# Patient Record
Sex: Female | Born: 1970 | Race: White | Hispanic: No | Marital: Married | Smoking: Never smoker
Health system: Southern US, Community
[De-identification: ages and names within clinical notes are randomized; demographics above are authoritative.]

## PROBLEM LIST (undated history)

## (undated) DIAGNOSIS — E119 Type 2 diabetes mellitus without complications: Secondary | ICD-10-CM

## (undated) DIAGNOSIS — F419 Anxiety disorder, unspecified: Secondary | ICD-10-CM

## (undated) DIAGNOSIS — I10 Essential (primary) hypertension: Secondary | ICD-10-CM

## (undated) DIAGNOSIS — K219 Gastro-esophageal reflux disease without esophagitis: Secondary | ICD-10-CM

## (undated) HISTORY — DX: Gastro-esophageal reflux disease without esophagitis: K21.9

## (undated) HISTORY — DX: Anxiety disorder, unspecified: F41.9

## (undated) HISTORY — DX: Type 2 diabetes mellitus without complications: E11.9

## (undated) HISTORY — DX: Essential (primary) hypertension: I10

---

## 2000-03-31 ENCOUNTER — Encounter: Payer: Self-pay | Admitting: Obstetrics and Gynecology

## 2000-03-31 ENCOUNTER — Ambulatory Visit (HOSPITAL_COMMUNITY): Admission: RE | Admit: 2000-03-31 | Discharge: 2000-03-31 | Payer: Self-pay | Admitting: Obstetrics and Gynecology

## 2000-05-19 ENCOUNTER — Ambulatory Visit (HOSPITAL_COMMUNITY): Admission: RE | Admit: 2000-05-19 | Discharge: 2000-05-19 | Payer: Self-pay | Admitting: Obstetrics & Gynecology

## 2000-05-19 ENCOUNTER — Encounter: Payer: Self-pay | Admitting: Obstetrics & Gynecology

## 2000-05-22 ENCOUNTER — Encounter (HOSPITAL_COMMUNITY): Admission: RE | Admit: 2000-05-22 | Discharge: 2000-08-03 | Payer: Self-pay | Admitting: Obstetrics & Gynecology

## 2000-06-16 ENCOUNTER — Encounter: Payer: Self-pay | Admitting: Obstetrics & Gynecology

## 2000-06-26 ENCOUNTER — Encounter: Payer: Self-pay | Admitting: Obstetrics & Gynecology

## 2000-07-24 ENCOUNTER — Encounter: Payer: Self-pay | Admitting: Obstetrics & Gynecology

## 2000-08-01 ENCOUNTER — Inpatient Hospital Stay (HOSPITAL_COMMUNITY): Admission: AD | Admit: 2000-08-01 | Discharge: 2000-08-04 | Payer: Self-pay | Admitting: Obstetrics & Gynecology

## 2000-08-05 ENCOUNTER — Encounter: Admission: RE | Admit: 2000-08-05 | Discharge: 2000-10-06 | Payer: Self-pay | Admitting: Obstetrics & Gynecology

## 2008-05-03 ENCOUNTER — Ambulatory Visit: Payer: Self-pay | Admitting: Obstetrics & Gynecology

## 2008-05-09 ENCOUNTER — Ambulatory Visit (HOSPITAL_COMMUNITY): Admission: RE | Admit: 2008-05-09 | Discharge: 2008-05-09 | Payer: Self-pay | Admitting: Gynecology

## 2008-05-18 ENCOUNTER — Ambulatory Visit: Payer: Self-pay | Admitting: Obstetrics and Gynecology

## 2008-05-18 ENCOUNTER — Other Ambulatory Visit: Admission: RE | Admit: 2008-05-18 | Discharge: 2008-05-18 | Payer: Self-pay | Admitting: Obstetrics and Gynecology

## 2008-05-18 ENCOUNTER — Encounter: Payer: Self-pay | Admitting: Obstetrics and Gynecology

## 2008-06-13 ENCOUNTER — Ambulatory Visit: Payer: Self-pay | Admitting: Obstetrics & Gynecology

## 2008-06-20 ENCOUNTER — Ambulatory Visit (HOSPITAL_COMMUNITY): Admission: RE | Admit: 2008-06-20 | Discharge: 2008-06-20 | Payer: Self-pay | Admitting: Gynecology

## 2008-06-20 ENCOUNTER — Ambulatory Visit: Payer: Self-pay | Admitting: Obstetrics & Gynecology

## 2008-07-11 ENCOUNTER — Ambulatory Visit: Payer: Self-pay | Admitting: Obstetrics & Gynecology

## 2008-07-15 ENCOUNTER — Ambulatory Visit: Payer: Self-pay | Admitting: Family Medicine

## 2008-07-15 ENCOUNTER — Encounter: Payer: Self-pay | Admitting: Obstetrics & Gynecology

## 2008-07-15 LAB — CONVERTED CEMR LAB
Albumin: 3.6 g/dL (ref 3.5–5.2)
Alkaline Phosphatase: 64 units/L (ref 39–117)
BUN: 10 mg/dL (ref 6–23)
CO2: 19 meq/L (ref 19–32)
Collection Interval-CRCL: 24 hr
Creatinine 24 HR UR: 2174 mg/24hr — ABNORMAL HIGH (ref 700–1800)
Creatinine Clearance: 216 mL/min — ABNORMAL HIGH (ref 75–115)
Glucose, Bld: 102 mg/dL — ABNORMAL HIGH (ref 70–99)
Hemoglobin: 12.4 g/dL (ref 12.0–15.0)
LDH: 133 units/L (ref 94–250)
MCHC: 32.4 g/dL (ref 30.0–36.0)
MCV: 86.7 fL (ref 78.0–100.0)
RBC: 4.42 M/uL (ref 3.87–5.11)
Total Bilirubin: 0.3 mg/dL (ref 0.3–1.2)
Uric Acid, Serum: 4.7 mg/dL (ref 2.4–7.0)
WBC: 12.2 10*3/uL — ABNORMAL HIGH (ref 4.0–10.5)

## 2008-08-01 ENCOUNTER — Ambulatory Visit: Payer: Self-pay | Admitting: Family Medicine

## 2008-08-08 ENCOUNTER — Ambulatory Visit: Payer: Self-pay | Admitting: Family Medicine

## 2008-08-08 ENCOUNTER — Encounter: Payer: Self-pay | Admitting: Obstetrics & Gynecology

## 2008-08-08 LAB — CONVERTED CEMR LAB
Hemoglobin: 12 g/dL (ref 12.0–15.0)
MCHC: 31.9 g/dL (ref 30.0–36.0)
MCV: 86.4 fL (ref 78.0–100.0)
RBC: 4.35 M/uL (ref 3.87–5.11)
RDW: 13.9 % (ref 11.5–15.5)

## 2008-08-19 ENCOUNTER — Ambulatory Visit (HOSPITAL_COMMUNITY): Admission: RE | Admit: 2008-08-19 | Discharge: 2008-08-19 | Payer: Self-pay | Admitting: Obstetrics & Gynecology

## 2008-08-22 ENCOUNTER — Ambulatory Visit: Payer: Self-pay | Admitting: Obstetrics and Gynecology

## 2008-09-07 ENCOUNTER — Ambulatory Visit: Payer: Self-pay | Admitting: Obstetrics and Gynecology

## 2008-09-14 ENCOUNTER — Ambulatory Visit (HOSPITAL_COMMUNITY): Admission: RE | Admit: 2008-09-14 | Discharge: 2008-09-14 | Payer: Self-pay | Admitting: Obstetrics & Gynecology

## 2008-09-19 ENCOUNTER — Ambulatory Visit: Payer: Self-pay | Admitting: Family Medicine

## 2008-09-19 ENCOUNTER — Encounter: Payer: Self-pay | Admitting: Obstetrics & Gynecology

## 2008-09-19 LAB — CONVERTED CEMR LAB
ALT: 8 units/L (ref 0–35)
AST: 15 units/L (ref 0–37)
Alkaline Phosphatase: 104 units/L (ref 39–117)
BUN: 7 mg/dL (ref 6–23)
Calcium: 8.6 mg/dL (ref 8.4–10.5)
Chloride: 106 meq/L (ref 96–112)
Creatinine, Ser: 0.6 mg/dL (ref 0.40–1.20)
HCT: 38.1 % (ref 36.0–46.0)
Hemoglobin: 12.7 g/dL (ref 12.0–15.0)
MCHC: 33.3 g/dL (ref 30.0–36.0)
Platelets: 321 10*3/uL (ref 150–400)
RDW: 13.1 % (ref 11.5–15.5)
Total Bilirubin: 0.3 mg/dL (ref 0.3–1.2)

## 2008-09-21 ENCOUNTER — Ambulatory Visit: Payer: Self-pay | Admitting: Family Medicine

## 2008-09-21 ENCOUNTER — Ambulatory Visit (HOSPITAL_COMMUNITY): Admission: RE | Admit: 2008-09-21 | Discharge: 2008-09-21 | Payer: Self-pay | Admitting: Family Medicine

## 2008-09-21 ENCOUNTER — Encounter: Payer: Self-pay | Admitting: Obstetrics & Gynecology

## 2008-09-21 LAB — CONVERTED CEMR LAB
Creatinine 24 HR UR: 1862 mg/24hr — ABNORMAL HIGH (ref 700–1800)
Creatinine Clearance: 216 mL/min — ABNORMAL HIGH (ref 75–115)
Creatinine, Urine: 62.1 mg/dL
Protein, Ur: 120 mg/24hr — ABNORMAL HIGH (ref 50–100)

## 2008-09-26 ENCOUNTER — Ambulatory Visit: Payer: Self-pay | Admitting: Obstetrics & Gynecology

## 2008-09-29 ENCOUNTER — Ambulatory Visit (HOSPITAL_COMMUNITY): Admission: RE | Admit: 2008-09-29 | Discharge: 2008-09-29 | Payer: Self-pay | Admitting: Family Medicine

## 2008-10-03 ENCOUNTER — Ambulatory Visit: Payer: Self-pay | Admitting: Obstetrics & Gynecology

## 2008-10-06 ENCOUNTER — Ambulatory Visit (HOSPITAL_COMMUNITY): Admission: RE | Admit: 2008-10-06 | Discharge: 2008-10-06 | Payer: Self-pay | Admitting: Family Medicine

## 2008-10-11 ENCOUNTER — Ambulatory Visit: Payer: Self-pay | Admitting: Obstetrics and Gynecology

## 2008-10-13 ENCOUNTER — Ambulatory Visit: Payer: Self-pay | Admitting: Obstetrics and Gynecology

## 2008-10-13 ENCOUNTER — Inpatient Hospital Stay (HOSPITAL_COMMUNITY): Admission: AD | Admit: 2008-10-13 | Discharge: 2008-10-13 | Payer: Self-pay | Admitting: Obstetrics & Gynecology

## 2008-10-16 ENCOUNTER — Ambulatory Visit: Payer: Self-pay | Admitting: Advanced Practice Midwife

## 2008-10-16 ENCOUNTER — Inpatient Hospital Stay (HOSPITAL_COMMUNITY): Admission: AD | Admit: 2008-10-16 | Discharge: 2008-10-16 | Payer: Self-pay | Admitting: Obstetrics & Gynecology

## 2008-10-17 ENCOUNTER — Ambulatory Visit: Payer: Self-pay | Admitting: Obstetrics & Gynecology

## 2008-10-18 ENCOUNTER — Encounter: Payer: Self-pay | Admitting: Obstetrics & Gynecology

## 2008-10-18 LAB — CONVERTED CEMR LAB
Chlamydia, DNA Probe: NEGATIVE
GC Probe Amp, Genital: NEGATIVE

## 2008-10-20 ENCOUNTER — Ambulatory Visit: Payer: Self-pay | Admitting: Obstetrics & Gynecology

## 2008-10-25 ENCOUNTER — Ambulatory Visit: Payer: Self-pay | Admitting: Obstetrics and Gynecology

## 2008-10-27 ENCOUNTER — Ambulatory Visit: Payer: Self-pay | Admitting: Obstetrics & Gynecology

## 2008-10-31 ENCOUNTER — Ambulatory Visit: Payer: Self-pay | Admitting: Family Medicine

## 2008-11-03 ENCOUNTER — Ambulatory Visit: Payer: Self-pay | Admitting: Obstetrics & Gynecology

## 2008-11-07 ENCOUNTER — Ambulatory Visit: Payer: Self-pay | Admitting: Obstetrics & Gynecology

## 2008-11-10 ENCOUNTER — Ambulatory Visit: Payer: Self-pay | Admitting: Family Medicine

## 2008-11-10 ENCOUNTER — Inpatient Hospital Stay (HOSPITAL_COMMUNITY): Admission: AD | Admit: 2008-11-10 | Discharge: 2008-11-12 | Payer: Self-pay | Admitting: Obstetrics and Gynecology

## 2009-01-02 ENCOUNTER — Ambulatory Visit: Payer: Self-pay | Admitting: Family Medicine

## 2009-05-03 ENCOUNTER — Emergency Department: Payer: Self-pay | Admitting: Emergency Medicine

## 2010-09-23 HISTORY — PX: CHOLECYSTECTOMY: SHX55

## 2010-10-26 IMAGING — US US OB FOLLOW-UP
1 series · 14 of 24 positions shown · non-contrast
Comparison: none

OBSTETRICAL ULTRASOUND:
 This ultrasound exam was performed in the [HOSPITAL] Ultrasound Department.  The OB US report was generated in the AS system, and faxed to the ordering physician.  This report is also available in [REDACTED] PACS.

[Series 1: us ob re-eval · 14 of 24 slices shown]
[im 1/24]
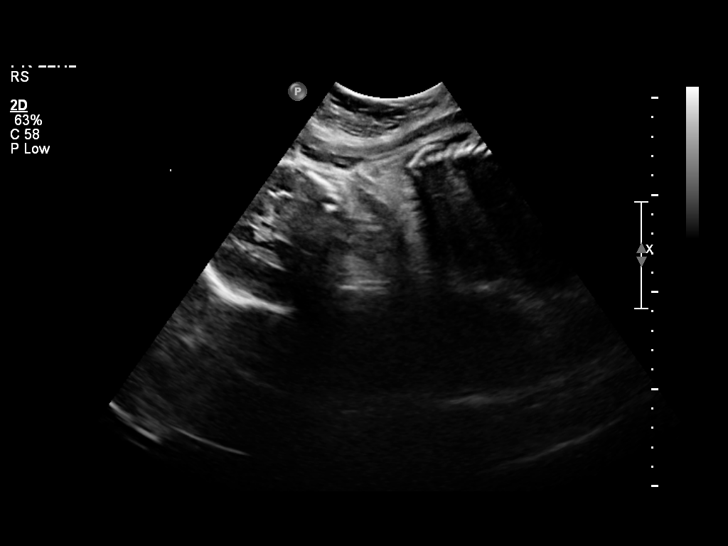
[im 3/24]
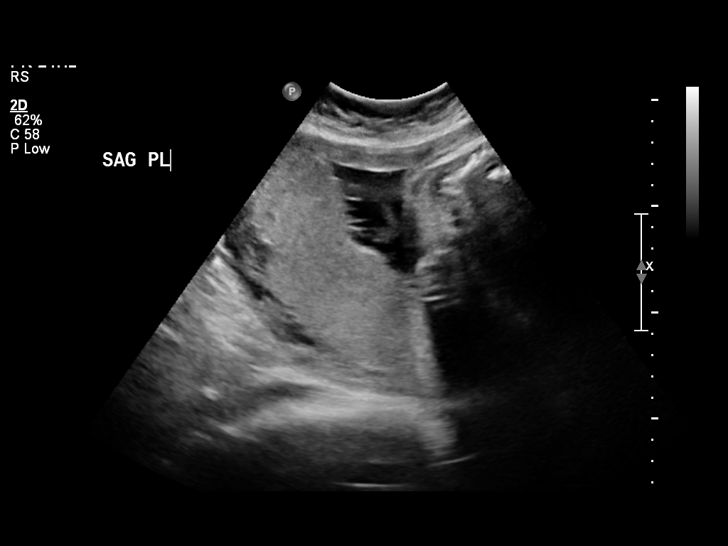
[im 5/24]
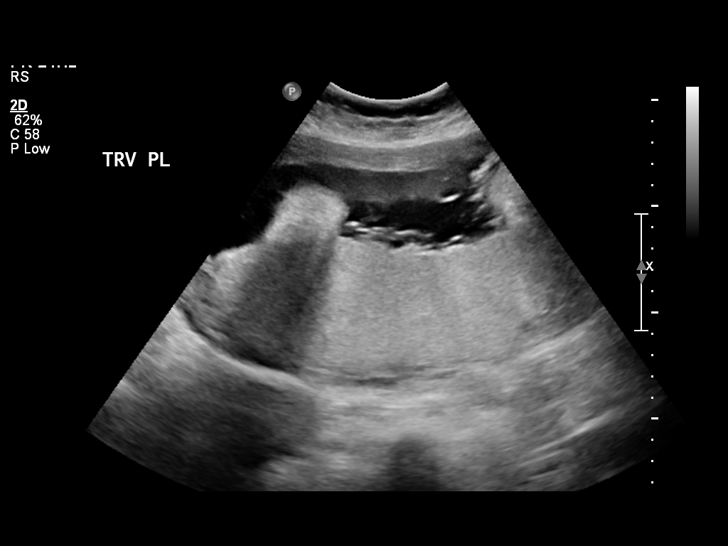
[im 7/24]
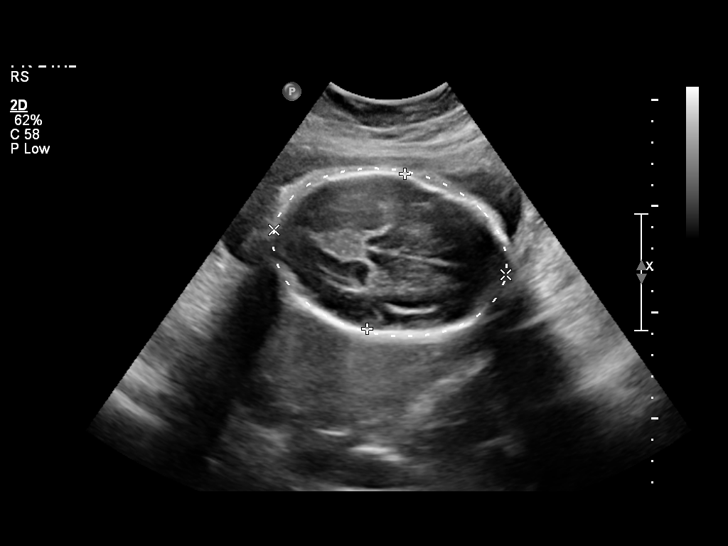
[im 8/24]
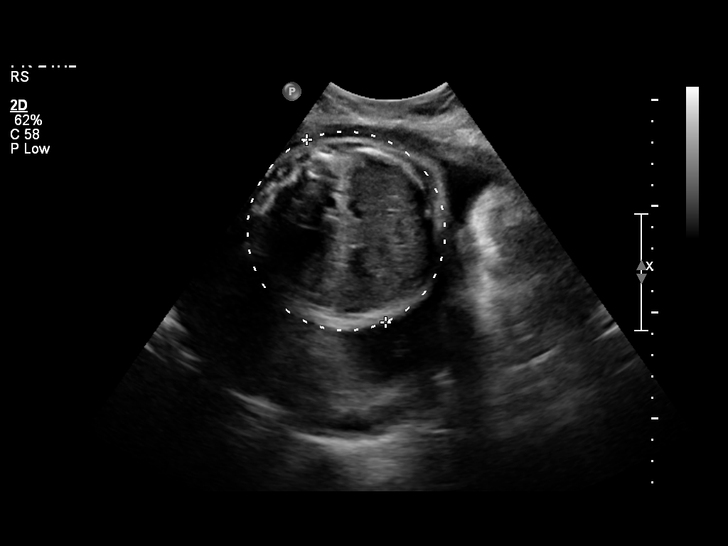
[im 10/24]
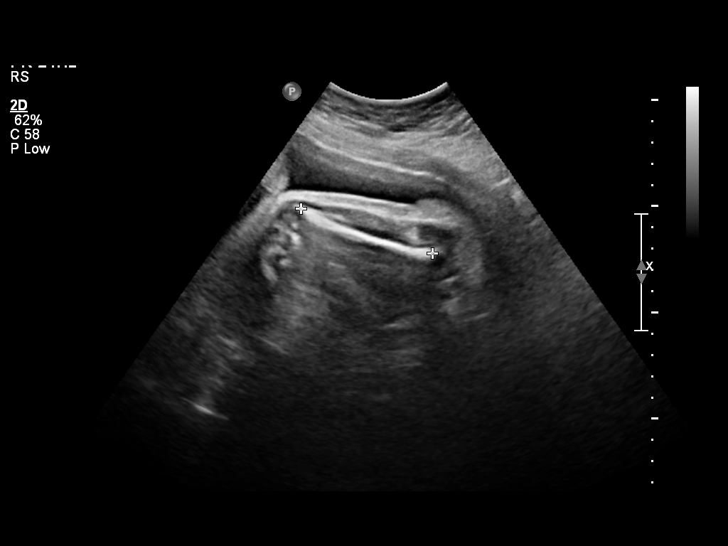
[im 12/24]
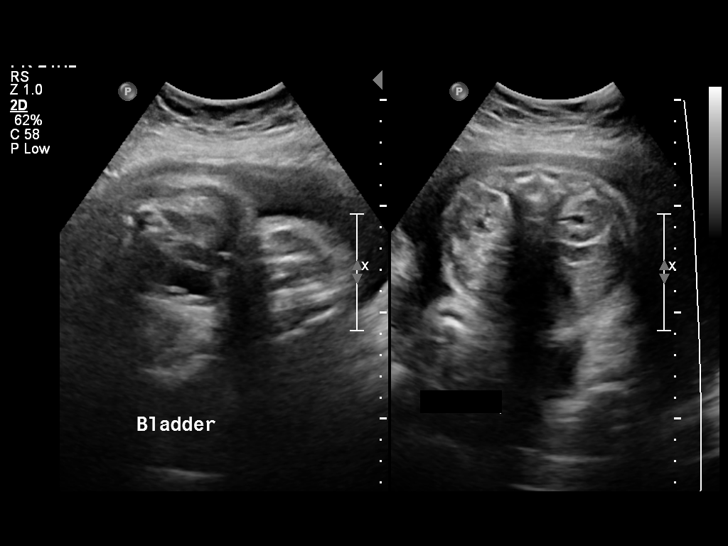
[im 13/24]
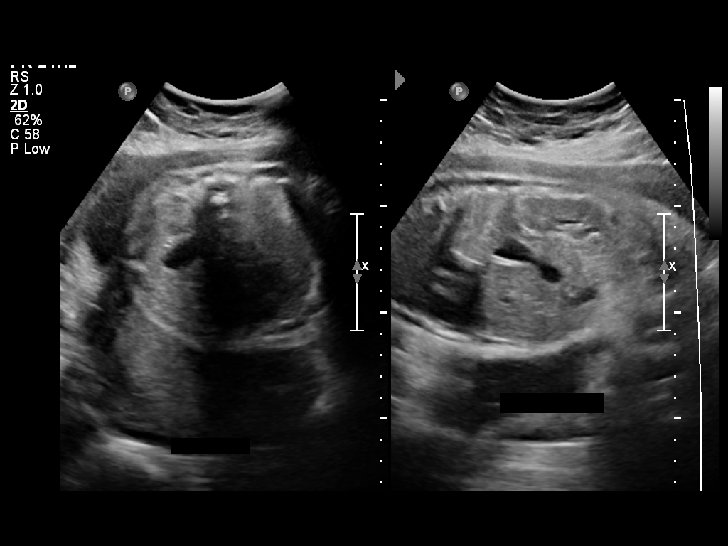
[im 15/24]
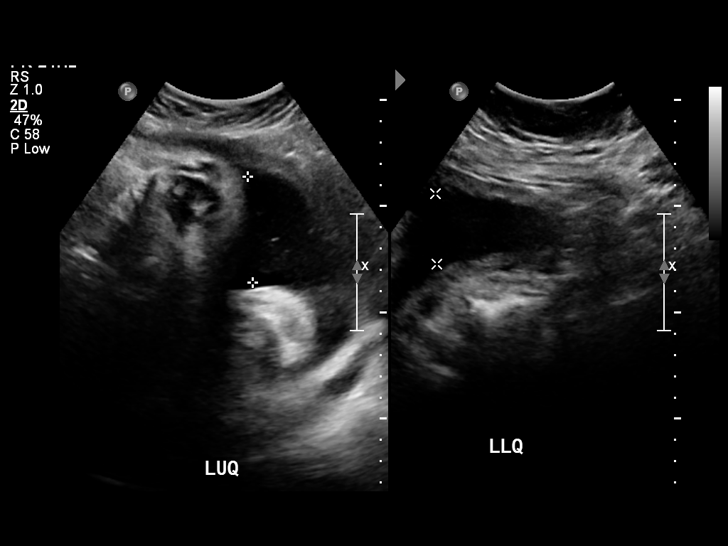
[im 17/24]
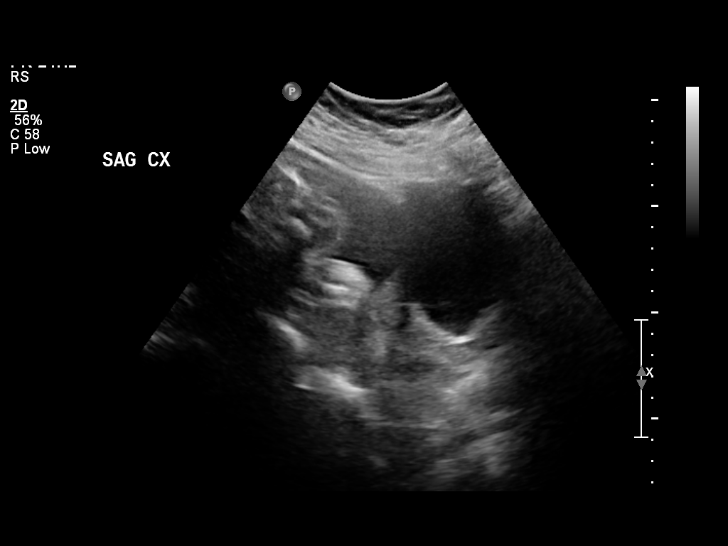
[im 19/24]
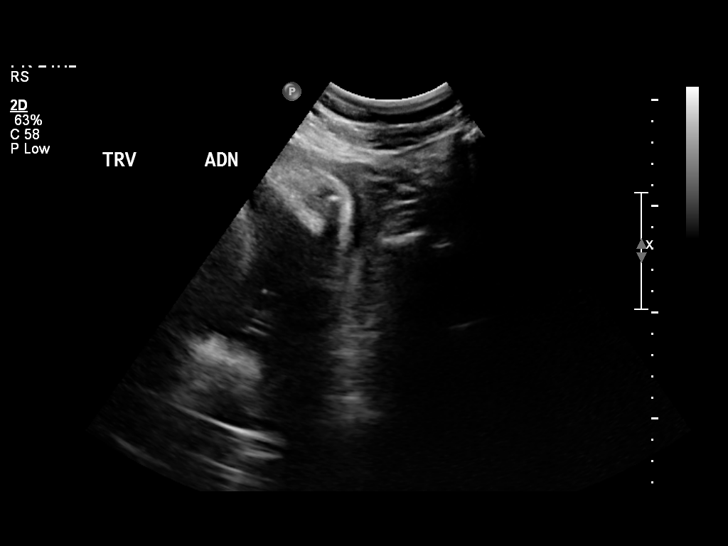
[im 20/24]
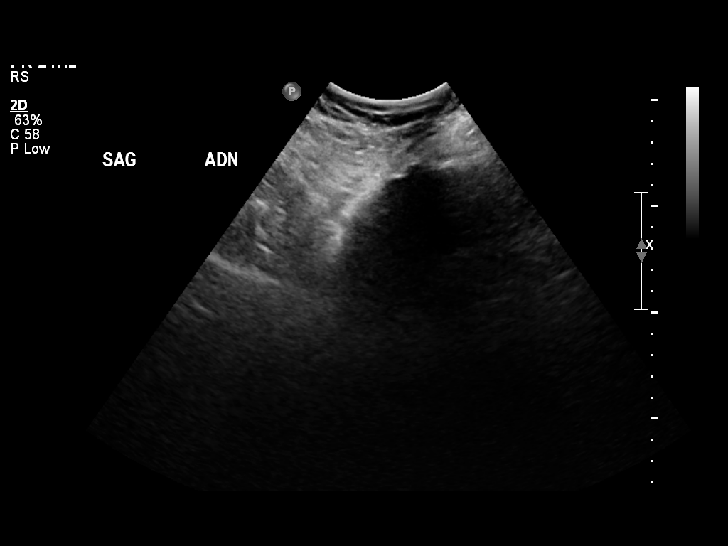
[im 22/24]
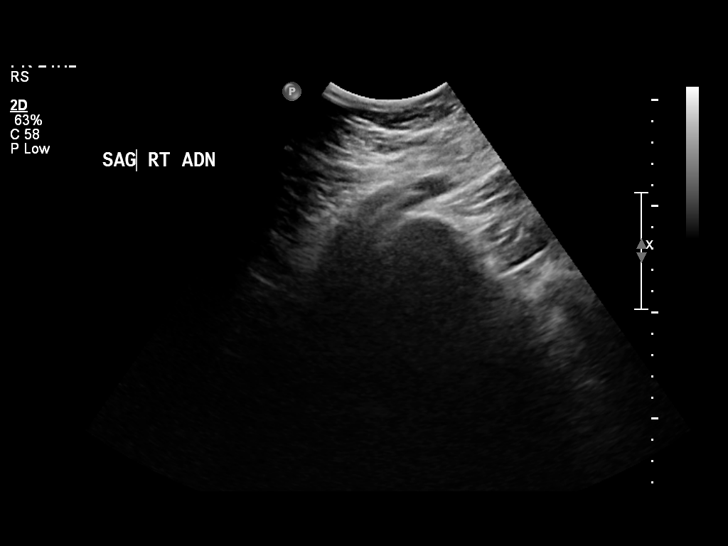
[im 24/24]
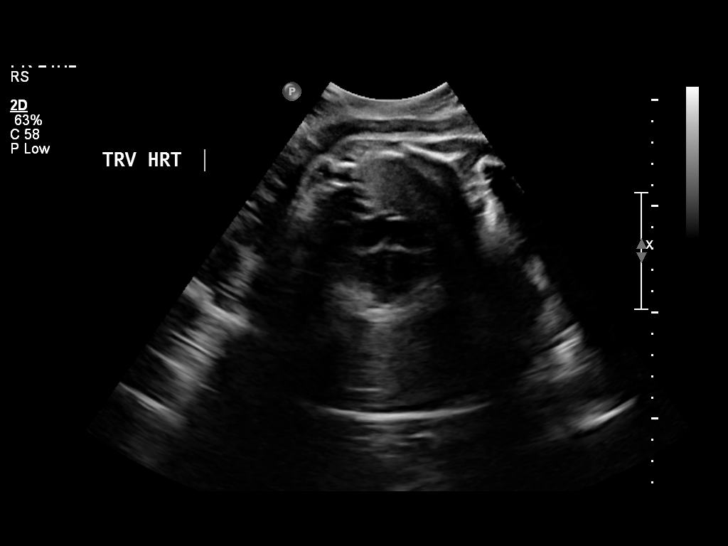

[14 of 24 positions shown; findings below may reference images not displayed]

IMPRESSION: See AS Obstetric US report.

## 2011-01-07 LAB — URINALYSIS, ROUTINE W REFLEX MICROSCOPIC
Bilirubin Urine: NEGATIVE
Glucose, UA: NEGATIVE mg/dL
Hgb urine dipstick: NEGATIVE
Protein, ur: NEGATIVE mg/dL
Urobilinogen, UA: 0.2 mg/dL (ref 0.0–1.0)

## 2011-01-07 LAB — URINE MICROSCOPIC-ADD ON

## 2011-01-08 LAB — COMPREHENSIVE METABOLIC PANEL
ALT: 11 U/L (ref 0–35)
Albumin: 2.6 g/dL — ABNORMAL LOW (ref 3.5–5.2)
Alkaline Phosphatase: 169 U/L — ABNORMAL HIGH (ref 39–117)
Chloride: 105 mEq/L (ref 96–112)
Glucose, Bld: 96 mg/dL (ref 70–99)
Potassium: 3.7 mEq/L (ref 3.5–5.1)
Sodium: 132 mEq/L — ABNORMAL LOW (ref 135–145)
Total Bilirubin: 0.2 mg/dL — ABNORMAL LOW (ref 0.3–1.2)
Total Protein: 5.6 g/dL — ABNORMAL LOW (ref 6.0–8.3)

## 2011-01-08 LAB — CBC
HCT: 35.1 % — ABNORMAL LOW (ref 36.0–46.0)
MCV: 78.8 fL (ref 78.0–100.0)
Platelets: 293 10*3/uL (ref 150–400)
RBC: 4.45 MIL/uL (ref 3.87–5.11)
WBC: 14.7 10*3/uL — ABNORMAL HIGH (ref 4.0–10.5)

## 2011-01-08 LAB — RPR: RPR Ser Ql: NONREACTIVE

## 2011-02-05 NOTE — Assessment & Plan Note (Signed)
[name], [name]              ACCOUNT NO.:  [id number]   MEDICAL RECORD NO.:  [id number]          PATIENT TYPE:  POB   LOCATION:  [facility] at [facility]         FACILITY:  [facility]   PHYSICIAN:  Tinnie Gens, MD        DATE OF BIRTH:  1970-10-16   DATE OF SERVICE:  01/02/2009                                  CLINIC NOTE   CHIEF COMPLAINT:  Postpartum check.   HISTORY OF PRESENT ILLNESS:  The patient is a 40 year old, gravida 3,  para 3, who is status post VBAC of a 10 pounds 4 ounce female who also had  a shoulder dystocia at the time of delivery.  She did have some initial  sort of decreased arm movement.  However, that has completely resolved.  She still has some issues with weight gain.  She has pumping, nursing,  and supplementing some with formula Tums.  He is finally gaining some  weight, but not really sleeping through the nights, had to wake up very  3 hours for feeding to keep his weight up, but she reports her mood is  well.  She is not really having not much issues, just tired.  She has  not resumed sexual intercourse, waiting for husband to make an  appointment for vasectomy.  She is on no medications for chronic  hypertension, which she is being followed for during her pregnancy.   PHYSICAL EXAMINATION:  VITAL SIGNS:  Today her weight is 247.  Her blood  pressure is 142/85.  GENERAL:  She is a well-developed, well-nourished female in no acute  distress.  GU:  Normal external female genitalia.  BUS normal.  Vagina is pink and  rugated.  Cervix is parous without lesion.  Uterus is small, anteverted.  No adnexal mass or tenderness.   IMPRESSION:  1. Postpartum check, doing well.  2. Chronic hypertension.  3. Obesity.   PLAN:  The patient has a Pap smear in August of this year.  She will  return for that and might follow up for birth control as needed and to  monitor her blood pressures.           ______________________________  Tinnie Gens, MD     TP/MEDQ  D:   01/02/2009  T:  01/03/2009  Job:  [id number]

## 2011-02-08 NOTE — Discharge Summary (Signed)
[facility]  Patient:    [name], [name] [facility]                   MRN: [id number] Adm. Date:  08/01/00 Disc. Date: 08/04/00 Attending:  Cleatrice Burke Dictator:   Nigel Bridgeman, C.N.M.                           Discharge Summary  ADMITTING DIAGNOSES:          1. Intrauterine pregnancy at term.                               2. Breech presentation.  DISCHARGE DIAGNOSES:          1. Intrauterine pregnancy at term.                               2. Breech presentation.  PROCEDURES:                   1. Primary low transverse cesarean section.                               2. Combination epidural and spinal anesthesia.  HOSPITAL COURSE:              [name] is a 40 year old, gravida 2, para 1-0-0-1, at 38-1/2 weeks who presented for scheduled C-section secondary to breech presentation.  Pregnancy had been remarkable for:                                1. Positive group B Strep.                               2. Polycystic ovarian syndrome.                               3. Chronic hypertension.                               4. History of infertility.                               5. Obesity.                               6. Subjective oligohydramnios                               7. Jehovahs Witness.  The patient was taken to the operating room where primary low transverse cesarean section was performed for breech presentation under a combination epidural and spinal.  Findings were a viable female by the name of McKenzie. Apgars were 9 and 9.  Weight was 8 pounds 7 ounces.  There were normal tubes and ovaries found.  There were no complications.  Estimated blood loss was 500 cc.  The patient was taken to the recovery room in good condition.  The  infant was taken to the full-term nursery in good condition.  On postoperative day #1, the patient was doing well.  Breast feeding was going slowly.  Her physical examination was within normal limits.  Her  hemoglobin on day #1 postpartum was 10.8, WBC count was 13.4.  She was electing condoms for contraception.  The rest of her hospital course was uncomplicated.  On postoperative day #3, her incision was clean, dry, and intact.  She was pumping more breast milk.  Her chest was clear and dry.  Lochia was scant and her fundus was fundus was firm.  She was deemed to have received full benefit of her hospital stay and was discharged home.  DISCHARGE INSTRUCTIONS:       Per [facility] handout.  DISCHARGE MEDICATIONS:        1. Motrin 600 mg p.o. q.6h. p.r.n. pain.                               2. Tylox 1-2 p.o. q.3-4h. p.r.n. pain.  DISCHARGE FOLLOWUP:           This will occur in six weeks at [facility]. DD:  08/04/00 TD:  08/04/00 Job: [id number]

## 2011-02-08 NOTE — Op Note (Signed)
[facility]  Patient:    [name], [name] [facility]                   MRN: [id number] Proc. Date: 08/01/00 Adm. Date:  [id number] Attending:  Cleatrice Burke                           Operative Report  PREOPERATIVE DIAGNOSIS:       Breech at term.  SURGEON:                      Cecilio Asper, M.D.  ASSISTANT:                    Nigel Bridgeman, C.N.M.  PROCEDURE:                    Low transverse cesarean delivery.  ANESTHESIA:                   Combination epidural and spinal.  ESTIMATED BLOOD LOSS:         500 cc.  URINE OUTPUT:                 300 cc.  FINDINGS:                     A viable female infant named McKenzie.  Apgars 9 and 9.  Weight 8 pounds 7 ounces.  Normal tubes and ovaries.  COMPLICATIONS:                None.  HISTORY:                      Patient is a 40 year old who has been followed during her pregnancy for borderline oligohydramnios.  She was found on multiple ultrasounds to be in breech presentation.  On the day of surgery the patient was properly identified and by ultrasound the breech was confirmed. She was therefore consented for cesarean delivery.  DESCRIPTION OF PROCEDURE:     There was some difficulty with achieving anesthesia.  The patient ended up with a combination epidural and spinal. Once an adequate level had been obtained patient was prepped in a sterile fashion.  A Foley was placed inside the bladder to drain it of urine and then the patient was subsequently draped.  A low transverse incision was made down through the subcutaneous fat to the fascia.  There were multiple subcutaneous bleeders that were made hemostatic with the Bovie.  The fascia was then incised on either side of the midline and extended laterally in both directions with the Mayo scissors.  Kocher clamps were placed on the superior edge of the incision and the fascia was removed from the rectus muscle both superiorly and inferiorly.   There were also a few rectus muscle bleeders that were made hemostatic with the Bovie.  Parietal peritoneum was identified and grasped and was incised with the Mayo scissors and extended bluntly.  Bladder piece was placed inside of the incision.  The visceroperitoneum of the lower uterine segment was elevated and incised in order to develop the bladder flap. Bladder piece was placed inside of this flap.  The uterus was entered in a low transverse cesarean fashion, extended laterally with bandage scissors.  The infant was found to be footling breech.  The feet were grasped without difficulty and breech extraction was performed using normal technique.  The infant was suctioned on the operative field.  Cord was doubly clamped and cut and the infant was taken to the warmer where she was cared for by the respiratory team.  Cord bloods were obtained.  Placenta was manually extracted.  Uterus firmed up nicely with Pitocin.  The uterus was removed from the pelvic abdominal region and covered with a wet lap sponge.  Bladder pieces were placed inside of the incision.  The uterine incision was grasped with ring clamps.  The uterus was wiped clean with a wet lap sponge.  The uterus was reapproximated with a running suture of #0 Vicryl.  A second embrocating layer was used with good hemostatic result.  Tubes and ovaries were noted to be normal.  The uterus was replaced inside of the pelvic abdominal region. Irrigation was performed.  Hemostasis was assured.  Parietal peritoneum was then reapproximated manually.  Fascia was then reapproximated from the lateral edge to the midline with a running suture of 1-0 Vicryl.  Subcuticular was closed with a running suture of 0 Vicryl.  Subcuticular bleeders were made hemostatic with the Bovie.  Skin staples were applied.  Bandages were applied. Patient tolerated procedure well.  She was taken to the recovery room in stable condition.  Needle, sponge, and instrument  counts were correct x 2.  POSTOPERATIVE DIAGNOSIS:      Breech at term. DD:  08/01/00 TD:  08/01/00 Job: [id number]

## 2011-02-08 NOTE — H&P (Signed)
[facility] [place]  Patient:    [name], [name] [facility]                   MRN: [id number] Adm. Date:  [id number] Attending:  Cleatrice Burke Dictator:   Nigel Bridgeman, C.N.M.                         History and Physical  HISTORY OF PRESENT ILLNESS:   Ms. Casad is a 40 year old gravida 2, para 1-0-0-1 at 52 weeks, who presents for scheduled cesarean section secondary to breech presentation.  Pregnancy has been remarkable for                                1. Positive group B strep.                               2. Polycystic ovarian syndrome.                               3. Chronic hypertension.                               4. History of infertility.                               5. Obesity.                               6. Subjective oligohydramnios.                               7. Jehovahs Witness.                               8. History of fibroids.  PRENATAL LABORATORIES:        Blood type is AB-positive, Rh antibody negative, VDRL nonreactive, rubella titer positive, hepatitis B surface antigen negative, HIV nonreactive, GC and chlamydia cultures were negative, Pap showed inflammatory changes, group B strep culture was positive for group B strep in April and May, one-hour glucose challenge was normal, early pregnancy and at 28 weeks her ______ was negative, her hemoglobin A1C was 5.2.  Hemoglobin at 26 weeks was 11.8.  EDC of August 18, 2000 was established by ultrasound at approximately nine weeks and was approximately eight days different from her LMP dating.  HISTORY OF PRESENT PREGNANCY: Patient entered care as a transfer from [facility] at approximately 14 weeks.  She originally planned nurse midwifery care.  She had an ultrasound done at 21 weeks that was within normal limits. She began having some elevations of her blood pressure at 25 weeks.  Medical doctor management was recommended at that point.  Patient had an  ultrasound performed at 26 weeks, which showed oligohydramnios.  She was placed on bedrest at that time.  Oligohydramnios was verified on repeat ultrasound at [facility] in two weeks.  NSTs were begun two times per week at 28 weeks.  She continued to have normal NSTs.  She had an ultrasound at  34 weeks, which showed normal Doppler flow.  Estimated fetal weight at 77th percentile, AFI of 10, but subjectively low fluid.  Rest of her pregnancy was essentially uncomplicated, however, the baby did remain in a breech presentation.  Her fluid did improve in a late third trimester ultrasound.  She then elected to proceed with scheduled cesarean section for breech.  OBSTETRICAL HISTORY:          In 1989, she had a vaginal birth of a female infant weighed 8 pounds 5 ounces at [redacted] weeks gestation.  She was in labor approximately 8 hours.  She had no anesthesia.  She was induced for postdate. She did have meconium-stained fluid.  MEDICAL HISTORY:              She had an ovarian cyst diagnosed in 1995.  She had fibroids diagnosed in 2001.  She was treated for infertility with metformin for seven months and then Clomid times one cycle in August 2000. She had one yeast infection in the past.  She does have a cat.  She had childhood illnesses.  She had a UTI in the past and in 1991.  Only hospitalization was for childbirth.  She was diagnosed with polycystic ovarian syndrome in the past and was on glucophage to regulate.  She has no known medication allergies.  She is allergic to [facility].  Patient had eczema.  FAMILY HISTORY:               Her mother has varicosities.  Her father had a stroke x 2 and is now deceased.  Her maternal grandmother had cervical or ovarian cancer.  GENETIC HISTORY:              Remarkable for the patients father having muscular dystrophy versus some type of multiple sclerosis.  SOCIAL HISTORY:               Patient is married to the father of the baby, he is involved  and supportive.  His name is Tametha Banning.  Patient is Caucasian. 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  Patient has her GED.  She is a Futures trader.  Her husband is high-school educated and is a Medical illustrator.  She was originally followed by the certified nurse midwifery service, but then transferred to the physician service in early second trimester secondary to hypertension.  She denies any alcohol, drug or tobacco use during this pregnancy.  PHYSICAL EXAMINATION:  VITAL SIGNS:                  Stable.  Patient is afebrile.  HEENT:                        Within normal limits.  LUNGS:                        Bilateral breath sounds are clear.  HEART:                        Regular rate and rhythm without murmur.  BREASTS:                      Soft and nontender.  ABDOMEN:                      Fundal height is approximately  39 to 40 cm. Estimated fetal weight is 8 to 8.5 pounds.  Uterine contractions are very sporadic and mild.  Fetal heart rate is in the 150s by Doppler.  PELVIC EXAMINATION:           Deferred.  EXTREMITIES:                  Deep tendon reflexes are 2+ without clonus.                               There is trace edema noted.  IMPRESSION:                   1. Intrauterine pregnancy at term.                               2. Breech presentation.                               3. History of polycystic ovarian syndrome.                               4. History of chronic hypertension, but no                                  medications needed.                               5. Patient is a Air traffic controller Witness and declines                                  blood or blood products.  PLAN:                         1. Admit to [facility] for                                  scheduled primary low-transverse cesarean                                  section.                                2. Routine physician orders.                               3. Declination of transfusion, guidelines are                                  contained within the patients chart. DD:  08/01/00  TD:  08/01/00 Job: [id number]

## 2014-01-17 DIAGNOSIS — E282 Polycystic ovarian syndrome: Secondary | ICD-10-CM | POA: Insufficient documentation

## 2014-10-25 DIAGNOSIS — E1142 Type 2 diabetes mellitus with diabetic polyneuropathy: Secondary | ICD-10-CM | POA: Insufficient documentation

## 2014-10-25 DIAGNOSIS — F419 Anxiety disorder, unspecified: Secondary | ICD-10-CM | POA: Insufficient documentation

## 2019-05-06 DIAGNOSIS — Z0000 Encounter for general adult medical examination without abnormal findings: Secondary | ICD-10-CM | POA: Insufficient documentation

## 2019-05-06 DIAGNOSIS — N939 Abnormal uterine and vaginal bleeding, unspecified: Secondary | ICD-10-CM | POA: Insufficient documentation

## 2019-11-05 LAB — LIPID PANEL
Cholesterol: 204 — AB (ref 0–200)
HDL: 58 (ref 35–70)
LDL Cholesterol: 103
Triglycerides: 214 — AB (ref 40–160)

## 2020-04-13 LAB — BASIC METABOLIC PANEL
BUN: 10 (ref 4–21)
CO2: 30 — AB (ref 13–22)
Chloride: 103 (ref 99–108)
Creatinine: 0.6 (ref 0.5–1.1)
Glucose: 178
Potassium: 4 (ref 3.4–5.3)
Sodium: 138 (ref 137–147)

## 2020-04-13 LAB — COMPREHENSIVE METABOLIC PANEL
Calcium: 10 (ref 8.7–10.7)
GFR calc Af Amer: 90
GFR calc non Af Amer: 90

## 2020-04-13 LAB — VITAMIN D 25 HYDROXY (VIT D DEFICIENCY, FRACTURES): Vit D, 25-Hydroxy: 30.7

## 2020-04-13 LAB — MICROALBUMIN, URINE: Microalb, Ur: 56.3

## 2020-04-13 LAB — HEMOGLOBIN A1C: Hemoglobin A1C: 7.7

## 2020-11-20 ENCOUNTER — Ambulatory Visit (INDEPENDENT_AMBULATORY_CARE_PROVIDER_SITE_OTHER): Payer: 59 | Admitting: Family Medicine

## 2020-11-20 ENCOUNTER — Encounter: Payer: Self-pay | Admitting: Family Medicine

## 2020-11-20 ENCOUNTER — Other Ambulatory Visit: Payer: Self-pay

## 2020-11-20 VITALS — BP 172/80 | HR 86 | Temp 98.2°F | Resp 16 | Ht 68.0 in | Wt 258.6 lb

## 2020-11-20 DIAGNOSIS — E785 Hyperlipidemia, unspecified: Secondary | ICD-10-CM

## 2020-11-20 DIAGNOSIS — R1319 Other dysphagia: Secondary | ICD-10-CM | POA: Diagnosis not present

## 2020-11-20 DIAGNOSIS — E1159 Type 2 diabetes mellitus with other circulatory complications: Secondary | ICD-10-CM

## 2020-11-20 DIAGNOSIS — K219 Gastro-esophageal reflux disease without esophagitis: Secondary | ICD-10-CM | POA: Diagnosis not present

## 2020-11-20 DIAGNOSIS — E1169 Type 2 diabetes mellitus with other specified complication: Secondary | ICD-10-CM

## 2020-11-20 DIAGNOSIS — E119 Type 2 diabetes mellitus without complications: Secondary | ICD-10-CM | POA: Insufficient documentation

## 2020-11-20 DIAGNOSIS — F410 Panic disorder [episodic paroxysmal anxiety] without agoraphobia: Secondary | ICD-10-CM | POA: Diagnosis not present

## 2020-11-20 DIAGNOSIS — Z2821 Immunization not carried out because of patient refusal: Secondary | ICD-10-CM

## 2020-11-20 DIAGNOSIS — I152 Hypertension secondary to endocrine disorders: Secondary | ICD-10-CM

## 2020-11-20 MED ORDER — CLONIDINE HCL 0.2 MG PO TABS
0.2000 mg | ORAL_TABLET | Freq: Two times a day (BID) | ORAL | 1 refills | Status: AC
Start: 2020-11-20 — End: ?

## 2020-11-20 MED ORDER — QUINAPRIL HCL 40 MG PO TABS
40.0000 mg | ORAL_TABLET | Freq: Two times a day (BID) | ORAL | 1 refills | Status: AC
Start: 2020-11-20 — End: ?

## 2020-11-20 MED ORDER — OMEPRAZOLE 20 MG PO CPDR: 20.0000 mg | DELAYED_RELEASE_CAPSULE | Freq: Two times a day (BID) | ORAL | 1 refills | Status: AC

## 2020-11-20 NOTE — Assessment & Plan Note (Signed)
Chronic and uncontrolled Last A1c elevated at 7.2 Goal A1c less than 7 On ACE inhibitor Not on statin Associated with/complicated by HTN and HLD Foot exam completed today Referral for eye exam Declines pneumococcal vaccination Recheck A1c Follow-up in 3 months

## 2020-11-20 NOTE — Assessment & Plan Note (Signed)
Uncontrolled today Will resume medications and monitor home BPs Likely does have some white coat hypertension that contributes as well Goal <130/90 on home readings in setting of diabetes At f/u, consider dose titration of meds vs adding thiazide

## 2020-11-20 NOTE — Assessment & Plan Note (Signed)
As above, worsening GERD with esophageal dysphagia Likely needs EGD Referral to GI placed today Continue twice daily PPI

## 2020-11-20 NOTE — Assessment & Plan Note (Signed)
Not currently on a statin Recheck CMP and FLP Goal LDL less than 70 Consider statin therapy pending lipid results

## 2020-11-20 NOTE — Progress Notes (Signed)
New patient visit   Patient: [name]   DOB: December 14, 1970   50 y.o. Female  MRN: [id number] Visit Date: 11/20/2020  Today's healthcare provider: Shirlee Latch, MD   Chief Complaint  Patient presents with  . New Patient (Initial Visit)   Subjective    [name] is a 50 y.o. female who presents today as a new patient to establish care.  HPI   T2DM: Last A1c at 7.7. Taking Victoza daily with good compliance and without side effects. Does not have eye doctor.  Reports that she was supposed to have an EGD for dysphagia, GERD, but never did, because she was afraid after watching youtube. Taking PPI daily.  HTN: Taking clonidine and quinapril. Reports white coat hypertension and fairly well controlled home readings.  Anxiety: has previously taken Xanax.  Now taking Ativan as she was waking with panic attacks.  This has decreased. Only taking Ativan about once per month. 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  Finds herself irritable at times. Has never taken SSRI/SNRI.    Past Medical History:  Diagnosis Date  . Anxiety   . Diabetes mellitus without complication (HCC)   . GERD (gastroesophageal reflux disease)   . Hypertension    Past Surgical History:  Procedure Laterality Date  . CESAREAN SECTION  2001  . CHOLECYSTECTOMY  2012   Family Status  Relation Name Status  . Mother  Deceased  . Father  Deceased  . Daughter  Alive  . Son  Alive  . Daughter  Alive  . Neg Hx  (Not Specified)   Family History  Problem Relation Age of Onset  . Hypertension Mother   . Heart attack Mother   . Stroke Father   . Multiple sclerosis Father   . Healthy Daughter   . Healthy Son   . Healthy Daughter   . Breast cancer Neg Hx   . Colon cancer Neg Hx    Social History   Socioeconomic History  . Marital status: Married    Spouse name: Not on file  . Number of children: 3  . Years of education: Not on file  . Highest education level: Not on file  Occupational  History  . Occupation: homemaker  Tobacco Use  . Smoking status: Never Smoker  . Smokeless tobacco: Never Used  Vaping Use  . Vaping Use: Never used  Substance and Sexual Activity  . Alcohol use: Never    Comment: rare intake  . Drug use: Never  . Sexual activity: Yes    Partners: Male    Birth control/protection: None  Other Topics Concern  . Not on file  Social History Narrative  . Not on file   Social Determinants of Health   Financial Resource Strain: Not on file  Food Insecurity: Not on file  Transportation Needs: Not on file  Physical Activity: Not on file  Stress: Not on file  Social Connections: Not on file   Outpatient Medications Prior to Visit  Medication Sig  . liraglutide (VICTOZA) 18 MG/3ML SOPN Inject into the skin daily.  Marland Kitchen LORazepam (ATIVAN) 0.5 MG tablet Take 0.5 mg by mouth as needed.  . [DISCONTINUED] cloNIDine (CATAPRES) 0.2 MG tablet Take 0.2 mg by mouth 2 (two) times daily.  . [DISCONTINUED] omeprazole (PRILOSEC) 20 MG capsule Take 20 mg by mouth 2 (two) times daily before a meal.  . [DISCONTINUED] quinapril (ACCUPRIL) 40 MG tablet Take 40 mg by mouth 2 (two) times daily.   No facility-administered  medications prior to visit.   Allergies  Allergen Reactions  . Lisinopril     Knee pain     There is no immunization history on file for this patient.  Health Maintenance  Topic Date Due  . HEMOGLOBIN A1C  Never done  . Hepatitis C Screening  Never done  . PNEUMOCOCCAL POLYSACCHARIDE VACCINE AGE 77-64 HIGH RISK  Never done  . OPHTHALMOLOGY EXAM  Never done  . PAP SMEAR-Modifier  Never done  . COLONOSCOPY (Pts 45-38yrs Insurance coverage will need to be confirmed)  Never done  . MAMMOGRAM  Never done  . INFLUENZA VACCINE  12/21/2020 (Originally 04/23/2020)  . COVID-19 Vaccine (1) 05/19/2021 (Originally 06/13/1976)  . FOOT EXAM  11/20/2021  . TETANUS/TDAP  01/27/2024  . HIV Screening  Completed    Patient Care Team: Erasmo Downer, MD  as PCP - General (Family Medicine)  Review of Systems  HENT: Positive for congestion and dental problem.   Respiratory: Positive for choking.   Gastrointestinal: Positive for abdominal distention.  Psychiatric/Behavioral: The patient is nervous/anxious.       Objective    BP (!) 172/80 (BP Location: Left Arm, Patient Position: Sitting, Cuff Size: Large)   Pulse 86   Temp 98.2 F (36.8 C) (Oral)   Resp 16   Ht 5\' 8"  (1.727 m)   Wt 258 lb 9.6 oz (117.3 kg)   SpO2 98%   BMI 39.32 kg/m     Physical Exam Vitals reviewed.  Constitutional:      General: She is not in acute distress.    Appearance: Normal appearance. She is well-developed. She is not diaphoretic.  HENT:     Head: Normocephalic and atraumatic.  Eyes:     General: No scleral icterus.    Conjunctiva/sclera: Conjunctivae normal.  Neck:     Thyroid: No thyromegaly.  Cardiovascular:     Rate and Rhythm: Normal rate and regular rhythm.     Pulses: Normal pulses.     Heart sounds: Normal heart sounds. No murmur heard.   Pulmonary:     Effort: Pulmonary effort is normal. No respiratory distress.     Breath sounds: Normal breath sounds. No wheezing, rhonchi or rales.  Musculoskeletal:     Cervical back: Neck supple.     Right lower leg: No edema.     Left lower leg: No edema.  Lymphadenopathy:     Cervical: No cervical adenopathy.  Skin:    General: Skin is warm and dry.     Findings: No rash.  Neurological:     Mental Status: She is alert and oriented to person, place, and time. Mental status is at baseline.  Psychiatric:        Mood and Affect: Mood normal.        Behavior: Behavior normal.    Depression Screen PHQ 2/9 Scores 11/20/2020  PHQ - 2 Score 0  PHQ- 9 Score 0   No results found for any visits on 11/20/20.  Assessment & Plan       Problem List Items Addressed This Visit      Cardiovascular and Mediastinum   Hypertension associated with diabetes (HCC)    Uncontrolled today Will  resume medications and monitor home BPs Likely does have some white coat hypertension that contributes as well Goal <130/90 on home readings in setting of diabetes At f/u, consider dose titration of meds vs adding thiazide      Relevant Medications   liraglutide (VICTOZA) 18 MG/3ML SOPN  cloNIDine (CATAPRES) 0.2 MG tablet   quinapril (ACCUPRIL) 40 MG tablet   Other Relevant Orders   Comprehensive metabolic panel     Digestive   Gastroesophageal reflux disease - Primary    As above, worsening GERD with esophageal dysphagia Likely needs EGD Referral to GI placed today Continue twice daily PPI      Relevant Medications   omeprazole (PRILOSEC) 20 MG capsule   Other Relevant Orders   Ambulatory referral to Gastroenterology   Esophageal dysphagia    Longstanding and intermittent issue Seems to be only with solids and sometimes pills, never with liquids Has been evaluated by GI in the past and was told that she needed an EGD, but she has never gotten this done Referral to GI placed today      Relevant Orders   Ambulatory referral to Gastroenterology     Endocrine   Diabetes mellitus (HCC)    Chronic and uncontrolled Last A1c elevated at 7.2 Goal A1c less than 7 On ACE inhibitor Not on statin Associated with/complicated by HTN and HLD Foot exam completed today Referral for eye exam Declines pneumococcal vaccination Recheck A1c Follow-up in 3 months      Relevant Medications   liraglutide (VICTOZA) 18 MG/3ML SOPN   quinapril (ACCUPRIL) 40 MG tablet   Other Relevant Orders   Hemoglobin A1c   Ambulatory referral to Ophthalmology   Hyperlipidemia associated with type 2 diabetes mellitus (HCC)    Not currently on a statin Recheck CMP and FLP Goal LDL less than 70 Consider statin therapy pending lipid results      Relevant Medications   liraglutide (VICTOZA) 18 MG/3ML SOPN   quinapril (ACCUPRIL) 40 MG tablet   Other Relevant Orders   Comprehensive metabolic panel    Lipid panel     Other   Panic attack    Chronic and intermittent issue Fairly well controlled at this time Continue Ativan very sparingly Discussed that if panic attacks become more frequent or she is having more frequent anxiety, would consider SSRI/SNRI Discussed that these medicines are first-line treatment for anxiety and not just depression Consider therapy      Relevant Medications   LORazepam (ATIVAN) 0.5 MG tablet   COVID-19 vaccination declined       Return in about 3 months (around 02/17/2021) for CPE.     Total time spent on today's visit was greater than 60 minutes, including both face-to-face time and nonface-to-face time personally spent on review of chart (labs and imaging), discussing labs and goals, discussing further work-up, treatment options, referrals to specialist if needed, reviewing outside records of pertinent, answering patient's questions, and coordinating care.   I, Shirlee Latch, MD, have reviewed all documentation for this visit. The documentation on 11/20/20 for the exam, diagnosis, procedures, and orders are all accurate and complete.   Reshad Saab, Marzella Schlein, MD, MPH [facility]

## 2020-11-20 NOTE — Assessment & Plan Note (Signed)
Chronic and intermittent issue Fairly well controlled at this time Continue Ativan very sparingly Discussed that if panic attacks become more frequent or she is having more frequent anxiety, would consider SSRI/SNRI Discussed that these medicines are first-line treatment for anxiety and not just depression Consider therapy

## 2020-11-20 NOTE — Assessment & Plan Note (Signed)
Longstanding and intermittent issue Seems to be only with solids and sometimes pills, never with liquids Has been evaluated by GI in the past and was told that she needed an EGD, but she has never gotten this done Referral to GI placed today

## 2020-11-21 ENCOUNTER — Telehealth: Payer: Self-pay

## 2020-11-21 DIAGNOSIS — Z79899 Other long term (current) drug therapy: Secondary | ICD-10-CM

## 2020-11-21 NOTE — Telephone Encounter (Signed)
No indication for Vit D, B12, Magnesium based on convo that we had. Potassium is part of what was ordered.

## 2020-11-21 NOTE — Telephone Encounter (Signed)
Copied from CRM [id number]. Topic: General - Other >> Nov 21, 2020 11:18 AM Mcneil, Ja-Kwan wrote: Reason for CRM: Pt requests that the following B-12, vitamin D, potassium, and magnesium be added to the lab order. Cb# [phone]

## 2020-11-23 NOTE — Telephone Encounter (Signed)
Ok to order (use high risk medication diagnosis). Ok to reschedule son in first available new patient appt (may be a bit)

## 2020-11-23 NOTE — Telephone Encounter (Signed)
Patient requesting vitamin B12 be checked due to taking Omeprazole daily. Patient reports that she has a history of low vitamin D. Please advise. Patient also requesting to reschedule her son as a new patient. Please advise.

## 2020-11-24 NOTE — Addendum Note (Signed)
Addended by: Hyacinth Meeker on: 11/24/2020 02:34 PM   Modules accepted: Orders

## 2020-11-27 ENCOUNTER — Encounter: Payer: Self-pay | Admitting: *Deleted

## 2020-11-29 LAB — LIPID PANEL
Chol/HDL Ratio: 4.2 ratio (ref 0.0–4.4)
Cholesterol, Total: 212 mg/dL — ABNORMAL HIGH (ref 100–199)
HDL: 51 mg/dL (ref >39)
LDL Chol Calc (NIH): 105 mg/dL — ABNORMAL HIGH (ref 0–99)
Triglycerides: 331 mg/dL — ABNORMAL HIGH (ref 0–149)
VLDL Cholesterol Cal: 56 mg/dL — ABNORMAL HIGH (ref 5–40)

## 2020-11-29 LAB — COMPREHENSIVE METABOLIC PANEL
ALT: 52 IU/L — ABNORMAL HIGH (ref 0–32)
AST: 46 IU/L — ABNORMAL HIGH (ref 0–40)
Albumin/Globulin Ratio: 1.6 (ref 1.2–2.2)
Albumin: 4.6 g/dL (ref 3.8–4.9)
Alkaline Phosphatase: 91 IU/L (ref 44–121)
BUN/Creatinine Ratio: 19 (ref 9–23)
BUN: 13 mg/dL (ref 6–24)
Bilirubin Total: 0.4 mg/dL (ref 0.0–1.2)
CO2: 22 mmol/L (ref 20–29)
Calcium: 9.8 mg/dL (ref 8.7–10.2)
Chloride: 98 mmol/L (ref 96–106)
Creatinine, Ser: 0.69 mg/dL (ref 0.57–1.00)
Globulin, Total: 2.8 g/dL (ref 1.5–4.5)
Glucose: 204 mg/dL — ABNORMAL HIGH (ref 65–99)
Potassium: 4.4 mmol/L (ref 3.5–5.2)
Sodium: 140 mmol/L (ref 134–144)
Total Protein: 7.4 g/dL (ref 6.0–8.5)
eGFR: 105 mL/min/{1.73_m2} (ref >59)

## 2020-11-29 LAB — VITAMIN D 25 HYDROXY (VIT D DEFICIENCY, FRACTURES): Vit D, 25-Hydroxy: 34 ng/mL (ref 30.0–100.0)

## 2020-11-29 LAB — HEMOGLOBIN A1C
Est. average glucose Bld gHb Est-mCnc: 197 mg/dL
Hgb A1c MFr Bld: 8.5 % — ABNORMAL HIGH (ref 4.8–5.6)

## 2020-11-29 LAB — VITAMIN B12: Vitamin B-12: 369 pg/mL (ref 232–1245)

## 2020-11-30 ENCOUNTER — Telehealth: Payer: Self-pay

## 2020-11-30 NOTE — Telephone Encounter (Signed)
-----   Message from Erasmo Downer, MD sent at 11/30/2020  5:05 PM EST ----- Normal B12 and vitamin D, but uncontrolled and elevated A1c, cholesterol.  Slightly elevated liver function as well.  This is likely related to uncontrolled A1c.  I would recommend switching from Victoza to weekly Ozempic 1 mg if patient is agreeable.  Recommend low-carb diet.  Recommend follow-up in 3 months to repeat A1c.  May need to add additional medication.  If she does not want to switch her Victoza to Ozempic, I would add Jardiance to the Ozempic.

## 2020-11-30 NOTE — Telephone Encounter (Signed)
I called pt and pt verbalized understanding of information below. Pt is wondering if her insurance will cover the ozempic? 3 mo f/u scheduled.

## 2020-12-01 ENCOUNTER — Telehealth: Payer: Self-pay

## 2020-12-01 NOTE — Telephone Encounter (Signed)
Noted  

## 2020-12-01 NOTE — Telephone Encounter (Signed)
It should. We won't know until we send it to the pharmacy and find out. If she's ok with it, please go ahead and send Rx

## 2020-12-05 NOTE — Progress Notes (Signed)
Ok. Still think she needs additional meds given the high A1c, but she can call back when she is finishing Victoza instead. Would f/u and recheck in 3 months at least

## 2020-12-28 LAB — HM DIABETES EYE EXAM

## 2021-01-01 ENCOUNTER — Other Ambulatory Visit: Payer: Self-pay | Admitting: Family Medicine

## 2021-01-01 NOTE — Telephone Encounter (Signed)
Copied from CRM [id number]. Topic: Quick Communication - Rx Refill/Question >> Jan 01, 2021  3:09 PM Marylen Ponto wrote: Medication: LORazepam (ATIVAN) 0.5 MG tablet  Has the patient contacted their pharmacy?  yes   Preferred Pharmacy (with phone number or street name): [facility] [address] - [place], [place] - [address]  Phone: [phone]   Fax: [phone]  Agent: Please be advised that RX refills may take up to 3 business days. We ask that you follow-up with your pharmacy.

## 2021-01-01 NOTE — Telephone Encounter (Signed)
Requested medication (s) are due for refill today:  yes  Requested medication (s) are on the active medication list: yes  Future visit scheduled:  yes  Notes to clinic:  medication filled by a historical provider  Review for refill    Requested Prescriptions  Pending Prescriptions Disp Refills   LORazepam (ATIVAN) 0.5 MG tablet 30 tablet     Sig: Take 1 tablet (0.5 mg total) by mouth as needed.      Not Delegated - Psychiatry:  Anxiolytics/Hypnotics Failed - 01/01/2021  3:16 PM      Failed - This refill cannot be delegated      Failed - Urine Drug Screen completed in last 360 days      Passed - Valid encounter within last 6 months    Recent Outpatient Visits           1 month ago Gastroesophageal reflux disease, unspecified whether esophagitis present   [facility] Bacigalupo, Marzella Schlein, MD       Future Appointments             In 2 months Bacigalupo, Marzella Schlein, MD [facility]

## 2021-01-03 MED ORDER — LORAZEPAM 0.5 MG PO TABS: 0.5000 mg | ORAL_TABLET | ORAL | 0 refills | Status: AC | PRN

## 2021-01-04 ENCOUNTER — Encounter: Payer: Self-pay | Admitting: Family Medicine

## 2021-01-22 ENCOUNTER — Other Ambulatory Visit: Payer: Self-pay

## 2021-01-22 ENCOUNTER — Encounter: Payer: Self-pay | Admitting: Gastroenterology

## 2021-01-22 ENCOUNTER — Ambulatory Visit (INDEPENDENT_AMBULATORY_CARE_PROVIDER_SITE_OTHER): Payer: 59 | Admitting: Gastroenterology

## 2021-01-22 VITALS — BP 193/92 | HR 98 | Temp 98.1°F | Ht 68.0 in | Wt 253.5 lb

## 2021-01-22 DIAGNOSIS — R140 Abdominal distension (gaseous): Secondary | ICD-10-CM

## 2021-01-22 DIAGNOSIS — R0989 Other specified symptoms and signs involving the circulatory and respiratory systems: Secondary | ICD-10-CM

## 2021-01-22 DIAGNOSIS — R1013 Epigastric pain: Secondary | ICD-10-CM | POA: Diagnosis not present

## 2021-01-22 DIAGNOSIS — R198 Other specified symptoms and signs involving the digestive system and abdomen: Secondary | ICD-10-CM

## 2021-01-22 DIAGNOSIS — R194 Change in bowel habit: Secondary | ICD-10-CM

## 2021-01-22 DIAGNOSIS — K219 Gastro-esophageal reflux disease without esophagitis: Secondary | ICD-10-CM | POA: Diagnosis not present

## 2021-01-22 MED ORDER — NA SULFATE-K SULFATE-MG SULF 17.5-3.13-1.6 GM/177ML PO SOLN: 354.0000 mL | Freq: Once | ORAL | 0 refills | Status: AC

## 2021-01-22 NOTE — Progress Notes (Signed)
Arlyss Repress, MD [address]  [address]  [place], [place] [address]  Main: [phone]  Fax: [phone]    Gastroenterology Consultation  Referring Provider:     Erasmo Downer, MD Primary Care Physician:  Erasmo Downer, MD Primary Gastroenterologist:  Dr. Arlyss Repress Reason for Consultation:     Epigastric pain, abdominal bloating, heartburn        HPI:   [name] is a 50 y.o. female referred by Dr. Beryle Flock, Marzella Schlein, MD  for consultation & management of several years history of abdominal bloating.  She reports that she had flareup of her GI symptoms in last 1 week after she returned from vacation.  She said she had salsa, hot sauce the day she returned from vacation that has triggered her flareup.  She is currently experiencing epigastric pain, discomfort in her chest and a sensation of food stuck in her chest associated with severe abdominal bloating.  She generally experiences upper abdominal bloating, currently in her lower abdomen as well.  She denies any constipation.  She does report having bowel movements of varying consistency.  Patient generally takes omeprazole 20 mg twice daily.  She was taking Zantac for several years.  She was recently off omeprazole until she had recent flareup.  She does acknowledge drinking several cans of sodas daily, regularly eats out, consumes red meat few times a week.  Patient believes that she has ulcer in her stomach and something is wrong in her intestines and is requesting upper endoscopy as well as colonoscopy.  She had delayed these procedures because she is very anxious about undergoing these procedures under anesthesia.  She did have history of panic attacks in the past.  She thinks her blood pressure is elevated due to anxiety created by her ongoing GI symptoms.   Her labs were reviewed, LFTs revealed mildly elevated transaminases, BMP normal, normal B12 and vitamin D levels, no known history of anemia.   Her most recent hemoglobin A1C is 8.5 in 3/22 Patient does not smoke or drink alcohol She denies any family history of GI malignancy  She reports that she is compliant with her blood pressure medication.  She does not want to take antidepressants although she has history of depression to avoid side effects including weight gain and fatigue.  She manages with Ativan for her anxiety  NSAIDs: None  Antiplts/Anticoagulants/Anti thrombotics: None  GI Procedures: None  Past Medical History:  Diagnosis Date  . Anxiety   . Diabetes mellitus without complication (HCC)   . GERD (gastroesophageal reflux disease)   . Hypertension     Past Surgical History:  Procedure Laterality Date  . CESAREAN SECTION  2001  . CHOLECYSTECTOMY  2012    Current Outpatient Medications:  .  cloNIDine (CATAPRES) 0.2 MG tablet, Take 1 tablet (0.2 mg total) by mouth 2 (two) times daily., Disp: 180 tablet, Rfl: 1 .  LORazepam (ATIVAN) 0.5 MG tablet, Take 1 tablet (0.5 mg total) by mouth as needed., Disp: 30 tablet, Rfl: 0 .  Na Sulfate-K Sulfate-Mg Sulf 17.5-3.13-1.6 GM/177ML SOLN, Take 354 mLs by mouth once for 1 dose., Disp: 354 mL, Rfl: 0 .  omeprazole (PRILOSEC) 20 MG capsule, Take 1 capsule (20 mg total) by mouth 2 (two) times daily before a meal., Disp: 180 capsule, Rfl: 1 .  quinapril (ACCUPRIL) 40 MG tablet, Take 1 tablet (40 mg total) by mouth 2 (two) times daily., Disp: 180 tablet, Rfl: 1 .  liraglutide (VICTOZA)  18 MG/3ML SOPN, Inject into the skin daily. (Patient not taking: Reported on 01/22/2021), Disp: , Rfl:    Family History  Problem Relation Age of Onset  . Hypertension Mother   . Heart attack Mother   . Stroke Father   . Multiple sclerosis Father   . Healthy Daughter   . Healthy Son   . Healthy Daughter   . Breast cancer Neg Hx   . Colon cancer Neg Hx      Social History   Tobacco Use  . Smoking status: Never Smoker  . Smokeless tobacco: Never Used  Vaping Use  . Vaping Use: Never  used  Substance Use Topics  . Alcohol use: Not Currently    Comment: rare intake  . Drug use: Never    Allergies as of 01/22/2021 - Review Complete 01/22/2021  Allergen Reaction Noted  . Lisinopril  02/04/2017    Review of Systems:    All systems reviewed and negative except where noted in HPI.   Physical Exam:  BP (!) 193/92 (BP Location: Left Arm, Patient Position: Sitting, Cuff Size: Large)   Pulse 98   Temp 98.1 F (36.7 C) (Oral)   Ht 5\' 8"  (1.727 m)   Wt 253 lb 8 oz (115 kg)   BMI 38.54 kg/m  No LMP recorded.  General:   Alert,  Well-developed, well-nourished, pleasant and cooperative in NAD Head:  Normocephalic and atraumatic. Eyes:  Sclera clear, no icterus.   Conjunctiva pink. Ears:  Normal auditory acuity. Nose:  No deformity, discharge, or lesions. Mouth:  No deformity or lesions,oropharynx pink & moist. Neck:  Supple; no masses or thyromegaly. Lungs:  Respirations even and unlabored.  Clear throughout to auscultation.   No wheezes, crackles, or rhonchi. No acute distress. Heart:  Regular rate and rhythm; no murmurs, clicks, rubs, or gallops. Abdomen:  Normal bowel sounds. Soft, non-tender and diffusely distended, tympanic to percussion without masses, hepatosplenomegaly or hernias noted.  No guarding or rebound tenderness.   Rectal: Not performed Msk:  Symmetrical without gross deformities. Good, equal movement & strength bilaterally. Pulses:  Normal pulses noted. Extremities:  No clubbing or edema.  No cyanosis. Neurologic:  Alert and oriented x3;  grossly normal neurologically. Skin:  Intact without significant lesions or rashes. No jaundice. Psych:  Alert and cooperative. Normal mood and affect.  Imaging Studies: No abdominal imaging  Assessment and Plan:   [name] is a 50 y.o. female with history of anxiety, diabetes hemoglobin A1c 8.5, hypertension is seen in consultation for symptoms of dyspepsia, abdominal bloating, altered bowel habits,  chronic GERD  Recommend upper endoscopy with esophageal, gastric and duodenal biopsies Recommend colonoscopy for altered bowel habits Advised her to increase omeprazole to 40 mg twice daily before meals meantime I tried to explain to the patient that her symptoms are most likely lifestyle related, she refused to accept this.  She wants to rule out any underlying pathology first Strict control of diabetes   Follow up in 6 to 8 weeks   44, MD

## 2021-01-24 ENCOUNTER — Telehealth: Payer: Self-pay

## 2021-01-24 NOTE — Telephone Encounter (Signed)
Called and left a message for call back  

## 2021-01-24 NOTE — Telephone Encounter (Signed)
Patient [name] she states that she has a colonoscopy w/EGD scheduled with Dr Allegra Lai.  She thinks she only wants to have the EGD performed due to the cost.  She also has questions regarding the cost of her procedure.  Please call pt back.  Thanks,  [place], [place]

## 2021-01-25 ENCOUNTER — Telehealth: Payer: Self-pay

## 2021-01-25 NOTE — Telephone Encounter (Signed)
Patient returned phone call back to [place].  She stated in her message that she wanted to have her procedure scheduled at an ambulatory center due to 3,900 cost to have it done at [facility].  Her procedures are scheduled for 01/31/21.  Thanks,  [place], [place]

## 2021-01-26 ENCOUNTER — Telehealth: Payer: Self-pay

## 2021-01-26 NOTE — Telephone Encounter (Signed)
Called and left a message for call back to find out more information.  

## 2021-01-26 NOTE — Telephone Encounter (Signed)
Patient has a 3,900 deductible. Please cancel procedure due to cost.

## 2021-01-26 NOTE — Telephone Encounter (Signed)
Patient states she can not going to have the procedures because she can not afford the procedure. She states that she wasted a 100 dollars on her visit to see Dr. Allegra Lai for no reason. Patient said to cancel her follow up appointment. She states she will find a new GI doctor. Called Trish and cancel the procedure

## 2021-01-29 NOTE — Telephone Encounter (Signed)
Sorry! I included Morrie Sheldon in the message.

## 2021-01-29 NOTE — Telephone Encounter (Signed)
Talk to patient on Friday and cancel the procedure

## 2021-01-31 ENCOUNTER — Encounter: Admission: RE | Payer: Self-pay | Source: Home / Self Care

## 2021-01-31 ENCOUNTER — Ambulatory Visit: Admission: RE | Admit: 2021-01-31 | Payer: 59 | Source: Home / Self Care | Admitting: Gastroenterology

## 2021-01-31 SURGERY — COLONOSCOPY WITH PROPOFOL
Anesthesia: General

## 2021-02-02 ENCOUNTER — Ambulatory Visit: Payer: Self-pay | Admitting: *Deleted

## 2021-02-02 DIAGNOSIS — E1169 Type 2 diabetes mellitus with other specified complication: Secondary | ICD-10-CM

## 2021-02-02 MED ORDER — METFORMIN HCL ER 500 MG PO TB24: 500.0000 mg | ORAL_TABLET | Freq: Two times a day (BID) | ORAL | 1 refills | Status: AC

## 2021-02-02 NOTE — Telephone Encounter (Signed)
No, she should not take another dose of Ozempic. It is very slow acting and will not bring her sugar down any faster, it will just make her sick if she takes 2 doses so close together.   She can take metformin, but I don't see that she has a prescription for this. If she doesn't have one I can send on in.

## 2021-02-02 NOTE — Telephone Encounter (Signed)
Advised patient as below. She is requesting that we send in refill for Metformin. [facility]. Thanks!

## 2021-02-02 NOTE — Telephone Encounter (Signed)
  Patient called to request if she should take any additional medication for elevated blood glucose of 308 today . Patient reports starting Ozempic pen yesterday and is unsure if she primed pen correctly. Patent gave her self dose of Ozempic yesterday and not sure if medication was actually administered. Patient checked blood glucose today and it was 308 asymptomatic at this time. Normal values run 220's. Patient attempted to prime Ozempic pen again and the medication squirted out and she did not see that yesterday. Patient would like to know if she should take another dose today of Ozempic or start back on her metformin through the weekend if her blood glucose remains high. Patient has metformin pills left. Instructed patient if blood glucose is still elevated call back and if symptomatic go to UC or ED for blood glucose over 300. Care advise given. Patient verbalized understanding  Of care advise and to call back or go to [facility] or ED. Patient reports her copay will be $1000 if she goes to ED. Please advise. Called [facility] , PCP not in today . No available appt today .  Reason for Disposition . [1] Caller has URGENT medication or insulin pump question AND [2] triager unable to answer question  Answer Assessment - Initial Assessment Questions 1. BLOOD GLUCOSE: "What is your blood glucose level?"      Last checked for 308 2. ONSET: "When did you check the blood glucose?"     Unsure of time  3. USUAL RANGE: "What is your glucose level usually?" (e.g., usual fasting morning value, usual evening value)    Baseline glucose 220's 4. KETONES: "Do you check for ketones (urine or blood test strips)?" If yes, ask: "What does the test show now?"      na 5. TYPE 1 or 2:  "Do you know what type of diabetes you have?"  (e.g., Type 1, Type 2, Gestational; doesn't know)      na 6. INSULIN: "Do you take insulin?" "What type of insulin(s) do you use? What is the mode of delivery? (syringe, pen; injection or pump)?"      Just  started Ozempic yesterday  7. DIABETES PILLS: "Do you take any pills for your diabetes?" If yes, ask: "Have you missed taking any pills recently?"     Unsure if priming Ozempic pen worked yesterday  8. OTHER SYMPTOMS: "Do you have any symptoms?" (e.g., fever, frequent urination, difficulty breathing, dizziness, weakness, vomiting)     Denies . Only frequent urination at night  9. PREGNANCY: "Is there any chance you are pregnant?" "When was your last menstrual period?"     na  Protocols used: DIABETES - HIGH BLOOD SUGAR-A-AH

## 2021-02-05 ENCOUNTER — Telehealth: Payer: Self-pay

## 2021-02-05 NOTE — Telephone Encounter (Signed)
Okay to discontinue metformin XR and start metformin 1000 mg twice daily #60 with 3 refills.

## 2021-02-05 NOTE — Telephone Encounter (Signed)
Spoke with patient on the phone who states that she was on regular Metformin before and states that she has not been use to the the extended release. She states that she went back to taking her regular Metformin over the weekend I reports blood sugar readings from 128-300, she states last night blood sugar was 260, she states that she plans on stopping extended release on Wednesday once she takes her  Weekly injection.KW

## 2021-02-05 NOTE — Telephone Encounter (Signed)
Patient advised, she states that she still; has 60 tablets of metformin and does not need a new prescription sent. Patient states that she will only be taking Metformin till Wednesday when she is to start ozempic she states and will be discontinuing metformin. KW

## 2021-02-05 NOTE — Telephone Encounter (Signed)
We talked about adding Ozempic to metformin, not changing

## 2021-02-05 NOTE — Telephone Encounter (Signed)
Lmtcb, okay for [facility] triage to deliver message below-kw

## 2021-02-05 NOTE — Telephone Encounter (Signed)
Copied from CRM [id number]. Topic: General - Other >> Feb 02, 2021  4:35 PM Gaetana Michaelis A wrote: Reason for CRM: Patient would like to be contacted regarding prescription coordination  Patient would like to modify the quanitiy of their metFORMIN prescription and also would like to not be prescribed the 24 hour extended release tablets  Patient believes they will need roughly 20(twenty) tablets)  Please contact to advise further when possible

## 2021-02-06 NOTE — Telephone Encounter (Signed)
Noted  

## 2021-02-06 NOTE — Telephone Encounter (Signed)
Pt. Given doctors message. Verbalizes understanding. States she will start Ozempic tomorrow, and after 2-3 days, will stop the Metformin. Will monitor her blood sugar. States "I don't to take Metformin forever."

## 2021-02-06 NOTE — Telephone Encounter (Signed)
Patient called, left VM to return the call to the office for a message from Dr. Bacigalupo. 

## 2021-03-05 ENCOUNTER — Ambulatory Visit: Payer: 59 | Admitting: Gastroenterology

## 2021-03-08 ENCOUNTER — Encounter: Payer: Self-pay | Admitting: Family Medicine

## 2021-04-30 ENCOUNTER — Ambulatory Visit: Payer: Self-pay | Admitting: *Deleted

## 2021-04-30 NOTE — Telephone Encounter (Signed)
Can see if anyone else has openings, but could also just refer to podiatry if she wants

## 2021-04-30 NOTE — Telephone Encounter (Signed)
Patient is calling to report she has bruise on toenail and corner of her nail coming up on R great toe. Patient states the bruise was there when she had her nails painted- but not the separation. She has separation on the corner- but can't get the gel polish off- she is having burning in that corner with polish remover. Patient was given appointment next week- but states she can come this Thursday if there is an opening- call sent over for review.

## 2021-04-30 NOTE — Telephone Encounter (Signed)
Patient reports she has toenails painted with gel polish. Patient is seeing dark color with nail coming up in the corner. Patient thinks she has bruised toenail- not sure how that happened.   Reason for Disposition  [1] Toenail comes off or is almost off AND [2] follows old injury AND [3] wants doctor to remove it    Patient has gel polish on toenail- burns when trying to remove polish. Nail may be trying to come off.  Answer Assessment - Initial Assessment Questions 1. MECHANISM: "How did the injury happen?"      Not sure 2. ONSET: "When did the injury happen?" (Minutes or hours ago)      Unsure- noticed injury to nail 3. LOCATION: "What part of the toe is injured?" "Is the nail damaged?"      Great toe- R, nail damaged 4. APPEARANCE of TOE INJURY: "What does the injury look like?"      Bruise under nail- and corner coming up 5. SEVERITY: "Can you use the foot normally?" "Can you walk?"      Yes- no pain- trying not to walk on toe too much 6. SIZE: For cuts, bruises, or swelling, ask: "How large is it?" (e.g., inches or centimeters;  entire toe)      Bruise on part of nail- middle- top/bottom 7. PAIN: "Is there pain?" If Yes, ask: "How bad is the pain?"   (e.g., Scale 1-10; or mild, moderate, severe)     no 8. TETANUS: For any breaks in the skin, ask: "When was the last tetanus booster?"     N/a 9. DIABETES: "Do you have a history of diabetes or poor circulation in the feet?"     Yes- diabetes 10. OTHER SYMPTOMS: "Do you have any other symptoms?"        no 11. PREGNANCY: "Is there any chance you are pregnant?" "When was your last menstrual period?"       N/a  Protocols used: Toe Injury-A-AH

## 2021-04-30 NOTE — Telephone Encounter (Signed)
Appointment made

## 2021-05-02 ENCOUNTER — Telehealth: Payer: Self-pay

## 2021-05-02 NOTE — Telephone Encounter (Signed)
Copied from CRM [id number]. Topic: Appointment Scheduling - Scheduling Inquiry for Clinic >> May 02, 2021 10:24 AM Gwenlyn Fudge wrote: Reason for CRM: Pt called and is requesting to cancel her appt with [facility] tomorrow morning. PT states that if she needs to reschedule she will call back. Please advise.

## 2021-05-03 ENCOUNTER — Ambulatory Visit: Payer: Self-pay | Admitting: Family Medicine

## 2021-05-07 ENCOUNTER — Ambulatory Visit: Payer: 59 | Admitting: Family Medicine

## 2021-06-05 ENCOUNTER — Encounter: Payer: Self-pay | Admitting: Family Medicine

## 2021-06-05 ENCOUNTER — Encounter: Payer: 59 | Admitting: Family Medicine

## 2021-10-11 LAB — HEMOGLOBIN A1C: Hemoglobin A1C: 7.3

## 2022-01-15 ENCOUNTER — Other Ambulatory Visit: Payer: Self-pay | Admitting: Family Medicine

## 2022-01-16 NOTE — Telephone Encounter (Signed)
Patient is due for appointment
